# Patient Record
Sex: Male | Born: 1950 | Hispanic: Yes | Marital: Married | Smoking: Never smoker
Health system: Southern US, Community
[De-identification: ages and names within clinical notes are randomized; demographics above are authoritative.]

---

## 2019-04-08 ENCOUNTER — Ambulatory Visit
Admission: EM | Admit: 2019-04-08 | Discharge: 2019-04-08 | Disposition: A | Discharge status: Home / Self Care | Payer: Self-pay | Attending: Physician Assistant | Admitting: Physician Assistant

## 2019-04-08 ENCOUNTER — Encounter: Payer: Self-pay | Admitting: Emergency Medicine

## 2019-04-08 ENCOUNTER — Other Ambulatory Visit: Payer: Self-pay

## 2019-04-08 ENCOUNTER — Ambulatory Visit (INDEPENDENT_AMBULATORY_CARE_PROVIDER_SITE_OTHER): Payer: Self-pay

## 2019-04-08 DIAGNOSIS — R059 Cough, unspecified: Secondary | ICD-10-CM

## 2019-04-08 DIAGNOSIS — R05 Cough: Secondary | ICD-10-CM

## 2019-04-08 MED ORDER — BENZONATATE 200 MG PO CAPS: 200.0000 mg | ORAL_CAPSULE | Freq: Three times a day (TID) | ORAL | 0 refills | Status: AC

## 2019-04-08 NOTE — Discharge Instructions (Signed)
Your xray is negative for infection. As discussed, cannot rule out COVID. Currently, no alarming signs. I would like you to quarantine for 7 days since symptoms onset AND >72 hours without fever, and improved respiratory symptoms. Continue to monitor, you can call COVID hotline [phone]) or use [facility] online if symptoms worsens to determine where you should seek care. If experiencing shortness of breath, trouble breathing, call 911 and provide them with your current situation.

## 2019-04-08 NOTE — ED Triage Notes (Signed)
Pt presents to [facility] for assessment of cough x 1 week with headaches and muscle aches.  Denies fevers.

## 2019-04-08 NOTE — ED Notes (Signed)
Patient able to ambulate independently  

## 2019-04-08 NOTE — ED Provider Notes (Signed)
[facility]    CSN: [id number] Arrival date & time: 04/08/19  1539     History   Chief Complaint Chief Complaint  Patient presents with  . Cough    HPI Richard Monroe is a 68 y.o. male.   68 year old male comes in with son for 1 week history of URI symptoms.  HPI obtained by patient through [place] translator.  He has had cough, headaches, body aches.  Denies rhinorrhea, nasal congestion, sore throat.  Denies fever.  States had a few episodes of chills that has since resolved.  He denies shortness of breath, wheezing.  No obvious sick contact.  Never smoker.  Patient came to visit from [place] 2 months ago, has been traveling around [place], no known COVID contact.     History reviewed. No pertinent past medical history.  There are no active problems to display for this patient.   History reviewed. No pertinent surgical history.     Home Medications    Prior to Admission medications   Medication Sig Start Date End Date Taking? Authorizing Provider  benzonatate (TESSALON) 200 MG capsule Take 1 capsule (200 mg total) by mouth every 8 (eight) hours. 04/08/19   Belinda Fisher, PA-C    Family History History reviewed. No pertinent family history.  Social History Social History   Tobacco Use  . Smoking status: Never Smoker  . Smokeless tobacco: Never Used  Substance Use Topics  . Alcohol use: Yes    Alcohol/week: 3.0 standard drinks    Types: 3 Cans of beer per week  . Drug use: Never     Allergies   Patient has no known allergies.   Review of Systems Review of Systems  Reason unable to perform ROS: See HPI as above.     Physical Exam Triage Vital Signs ED Triage Vitals  Enc Vitals Group     BP 04/08/19 1556 138/89     Pulse Rate 04/08/19 1556 73     Resp 04/08/19 1556 18     Temp 04/08/19 1556 98.6 F (37 C)     Temp Source 04/08/19 1556 Oral     SpO2 04/08/19 1556 93 %     Weight --      Height --      Head Circumference --    Peak Flow --      Pain Score 04/08/19 1557 10     Pain Loc --      Pain Edu? --      Excl. in GC? --    No data found.  Updated Vital Signs BP 138/89 (BP Location: Right Arm)   Pulse 73   Temp 98.6 F (37 C) (Oral)   Resp 18   SpO2 93%   Physical Exam Constitutional:      General: He is not in acute distress.    Appearance: Normal appearance. He is not ill-appearing, toxic-appearing or diaphoretic.  HENT:     Head: Normocephalic and atraumatic.     Mouth/Throat:     Mouth: Mucous membranes are moist.     Pharynx: Oropharynx is clear. Uvula midline.  Neck:     Musculoskeletal: Normal range of motion and neck supple.  Cardiovascular:     Rate and Rhythm: Normal rate and regular rhythm.     Heart sounds: Normal heart sounds. No murmur. No friction rub. No gallop.   Pulmonary:     Effort: Pulmonary effort is normal. No accessory muscle usage, prolonged expiration, respiratory distress or  retractions.     Comments: Speaking in full sentences. Lungs clear to auscultation without adventitious lung sounds. Neurological:     General: No focal deficit present.     Mental Status: He is alert and oriented to person, place, and time.      UC Treatments / Results  Labs (all labs ordered are listed, but only abnormal results are displayed) Labs Reviewed - No data to display  EKG None  Radiology Dg Chest 2 View  Result Date: 04/08/2019 CLINICAL DATA:  Cough EXAM: CHEST - 2 VIEW COMPARISON:  None. FINDINGS: Heart and mediastinal contours are within normal limits. No focal opacities or effusions. No acute bony abnormality. IMPRESSION: No active cardiopulmonary disease. Electronically Signed   By: Charlett NoseKevin  Dover M.D.   On: 04/08/2019 16:55    Procedures Procedures (including critical care time)  Medications Ordered in UC Medications - No data to display  Initial Impression / Assessment and Plan / UC Course  I have reviewed the triage vital signs and the nursing notes.   Pertinent labs & imaging results that were available during my care of the patient were reviewed by me and considered in my medical decision making (see chart for details).    Xray negative for pneumonia. Patient speaking in full sentences without respiratory distress. Afebrile without antipyretic in last 8 hours. No tachycardia, tachypnea. Lungs CTAB. Satting at 93-95% on room air. COVID testing limited to inpatient. Given history and exam, will have patient self quarantine. Instructions on when to end quarantine, family member isolation discussed, and resources provided. Symptomatic treatment discussed. Return precautions given. Patient expresses understanding and agrees to plan.  Final Clinical Impressions(s) / UC Diagnoses   Final diagnoses:  Cough    ED Prescriptions    Medication Sig Dispense Auth. Provider   benzonatate (TESSALON) 200 MG capsule Take 1 capsule (200 mg total) by mouth every 8 (eight) hours. 21 capsule Threasa AlphaYu, Amy V, PA-C        Yu, Amy V, [place]-C 04/08/19 1708

## 2019-04-10 ENCOUNTER — Ambulatory Visit
Admission: EM | Admit: 2019-04-10 | Discharge: 2019-04-10 | Disposition: A | Discharge status: Home / Self Care | Payer: Self-pay | Attending: Emergency Medicine | Admitting: Emergency Medicine

## 2019-04-10 DIAGNOSIS — R05 Cough: Secondary | ICD-10-CM

## 2019-04-10 DIAGNOSIS — R059 Cough, unspecified: Secondary | ICD-10-CM

## 2019-04-10 DIAGNOSIS — Z20822 Contact with and (suspected) exposure to covid-19: Secondary | ICD-10-CM

## 2019-04-10 DIAGNOSIS — R509 Fever, unspecified: Secondary | ICD-10-CM

## 2019-04-10 MED ORDER — ACETAMINOPHEN 325 MG PO TABS
975.0000 mg | ORAL_TABLET | Freq: Once | ORAL | Status: AC
  Administered 2019-04-10: 975 mg via ORAL

## 2019-04-10 MED ORDER — HYDROCODONE-HOMATROPINE 5-1.5 MG/5ML PO SYRP: 5.0000 mL | ORAL_SOLUTION | Freq: Four times a day (QID) | ORAL | 0 refills | Status: AC | PRN

## 2019-04-10 NOTE — ED Provider Notes (Signed)
[facility]    CSN: [id number] Arrival date & time: 04/10/19  1657     History   Chief Complaint Chief Complaint  Patient presents with  . Cough    HPI Richard Monroe is a 68 y.o. male presenting with his son for unimproved cough and new onset fever.  Patient's son provides translation.  Patient's cough is unchanged from last visit (nonproductive, non-hemoptic).  Patient tried tessalon w/o relief, is now having head and stomach pain when he coughs and is having poor sleep/appetite.  Patient denies change in vision, weakness, nasal congestion/sinus pressure, chest pain, SOB, nausea/vomiting, diarrhea.  CXR done on 04/08/19 was negative for acute process.  Patient was found to be febrile in office, was responsive to APAP in office.  Has not tried this at home; has APAP & thermometer at home to monitor temp.  HPI from 04/08/19 visit:  "68 year old male comes in with son for 1 week history of URI symptoms.  HPI obtained by patient through [place] translator.  He has had cough, headaches, body aches.  Denies rhinorrhea, nasal congestion, sore throat.  Denies fever.  States had a few episodes of chills that has since resolved.  He denies shortness of breath, wheezing.  No obvious sick contact.  Never smoker.  Patient came to visit from [place] 2 months ago, has been traveling around [place], no known COVID contact."  History reviewed. No pertinent past medical history.  There are no active problems to display for this patient.   History reviewed. No pertinent surgical history.     Home Medications    Prior to Admission medications   Medication Sig Start Date End Date Taking? Authorizing Provider  benzonatate (TESSALON) 200 MG capsule Take 1 capsule (200 mg total) by mouth every 8 (eight) hours. 04/08/19   Cathie Hoops, Amy V, PA-C  HYDROcodone-homatropine (HYCODAN) 5-1.5 MG/5ML syrup Take 5 mLs by mouth every 6 (six) hours as needed for up to 5 days for cough. 04/10/19 04/15/19   Hall-Potvin, [place], PA-C    Family History No family history on file.  Social History Social History   Tobacco Use  . Smoking status: Never Smoker  . Smokeless tobacco: Never Used  Substance Use Topics  . Alcohol use: Yes    Alcohol/week: 3.0 standard drinks    Types: 3 Cans of beer per week  . Drug use: Never     Allergies   Patient has no known allergies.   Review of Systems Review of Systems  Constitutional: Positive for appetite change, fatigue and fever.  HENT: Negative for congestion, postnasal drip, rhinorrhea, sinus pressure, sinus pain, sore throat and trouble swallowing.   Eyes: Negative for pain and visual disturbance.  Respiratory: Positive for cough. Negative for chest tightness, shortness of breath and wheezing.   Cardiovascular: Negative for chest pain and palpitations.  Gastrointestinal: Positive for abdominal pain. Negative for diarrhea, nausea and vomiting.  Musculoskeletal: Negative for arthralgias and myalgias.  Neurological: Positive for headaches. Negative for dizziness and weakness.     Physical Exam Triage Vital Signs ED Triage Vitals  Enc Vitals Group     BP 04/10/19 1710 121/76     Pulse Rate 04/10/19 1710 86     Resp 04/10/19 1710 18     Temp 04/10/19 1710 (!) 103.2 F (39.6 C)     Temp Source 04/10/19 1710 Oral     SpO2 04/10/19 1710 94 %     Weight --      Height --  Head Circumference --      Peak Flow --      Pain Score 04/10/19 1711 0     Pain Loc --      Pain Edu? --      Excl. in GC? --    No data found.  Updated Vital Signs BP 121/76 (BP Location: Left Arm)   Pulse 86   Temp 99.7 F (37.6 C)   Resp 18   SpO2 94%   Visual Acuity Right Eye Distance:   Left Eye Distance:   Bilateral Distance:    Right Eye Near:   Left Eye Near:    Bilateral Near:     Physical Exam Constitutional:      General: He is not in acute distress.    Appearance: He is normal weight. He is not ill-appearing.  HENT:     Head:  Normocephalic and atraumatic.     Right Ear: Tympanic membrane, ear canal and external ear normal.     Left Ear: Tympanic membrane, ear canal and external ear normal.     Nose: Nose normal.     Mouth/Throat:     Mouth: Mucous membranes are moist.     Pharynx: Oropharynx is clear. No oropharyngeal exudate or posterior oropharyngeal erythema.  Eyes:     Extraocular Movements: Extraocular movements intact.     Conjunctiva/sclera: Conjunctivae normal.     Pupils: Pupils are equal, round, and reactive to light.  Neck:     Musculoskeletal: Neck supple.  Cardiovascular:     Rate and Rhythm: Normal rate and regular rhythm.  Pulmonary:     Effort: Pulmonary effort is normal. No respiratory distress.     Breath sounds: Normal breath sounds. No wheezing, rhonchi or rales.  Chest:     Chest wall: No tenderness.  Lymphadenopathy:     Cervical: No cervical adenopathy.  Neurological:     General: No focal deficit present.     Mental Status: He is alert and oriented to person, place, and time. Mental status is at baseline.      UC Treatments / Results  Labs (all labs ordered are listed, but only abnormal results are displayed) Labs Reviewed - No data to display  EKG None  Radiology No results found.  Procedures Procedures (including critical care time)  Medications Ordered in UC Medications  acetaminophen (TYLENOL) tablet 975 mg (975 mg Oral Given 04/10/19 1718)    Initial Impression / Assessment and Plan / UC Course  I have reviewed the triage vital signs and the nursing notes.  Pertinent labs & imaging results that were available during my care of the patient were reviewed by me and considered in my medical decision making (see chart for details).     Patient who was recently seen for cough, now here for lack of improvement with tessalon.  Patient was found to be febrile in office, was responsive to APAP in office.  Has not tried this at home; has APAP & thermometer at home to  monitor temp.  Patient was not in distress at time of appt.  Discussed possible COVID due to recent international travel history and symptomatology. Will continue symptomatic treatment with strict return precautions.    Final Clinical Impressions(s) / UC Diagnoses   Final diagnoses:  Cough  Fever, unspecified  Suspected Covid-19 Virus Infection     Discharge Instructions     Patient to start cough syrup PRN for cough. Discussed possible antibiotic if no improvement in nest 48-72 hours.  Will take OTC tylenol as needed for fever and monitor temperature at home. Discussed strict return precautions and to go to ER if symptoms worsen as well as isolation and hygiene regimen.  Patient and/or son will call clinic with any questions.    ED Prescriptions    Medication Sig Dispense Auth. Provider   HYDROcodone-homatropine (HYCODAN) 5-1.5 MG/5ML syrup Take 5 mLs by mouth every 6 (six) hours as needed for up to 5 days for cough. 100 mL Hall-Potvin, [place], PA-C     Controlled Substance Prescriptions [place] Controlled Substance Registry consulted? Yes, I have consulted the [place] Controlled Substances Registry for this patient, and feel the risk/benefit ratio today is favorable for proceeding with this prescription for a controlled substance.   Hall-Potvin, [place], [place]-C 04/10/19 1808

## 2019-04-10 NOTE — Discharge Instructions (Addendum)
Patient to start cough syrup PRN for cough. Discussed possible antibiotic if no improvement in nest 48-72 hours. Will take OTC tylenol as needed for fever and monitor temperature at home. Discussed strict return precautions and to go to ER if symptoms worsen as well as isolation and hygiene regimen.  Patient and/or son will call clinic with any questions.

## 2019-04-10 NOTE — ED Triage Notes (Signed)
Pt c/o productive cough x6 days with white sputum. States abdomen/back pain from coughing.

## 2020-09-06 IMAGING — DX CHEST - 2 VIEW
2 series · 2 of 2 positions shown · non-contrast
Comparison: None.

CLINICAL DATA: Cough

EXAM:
CHEST - 2 VIEW

[chest pa]
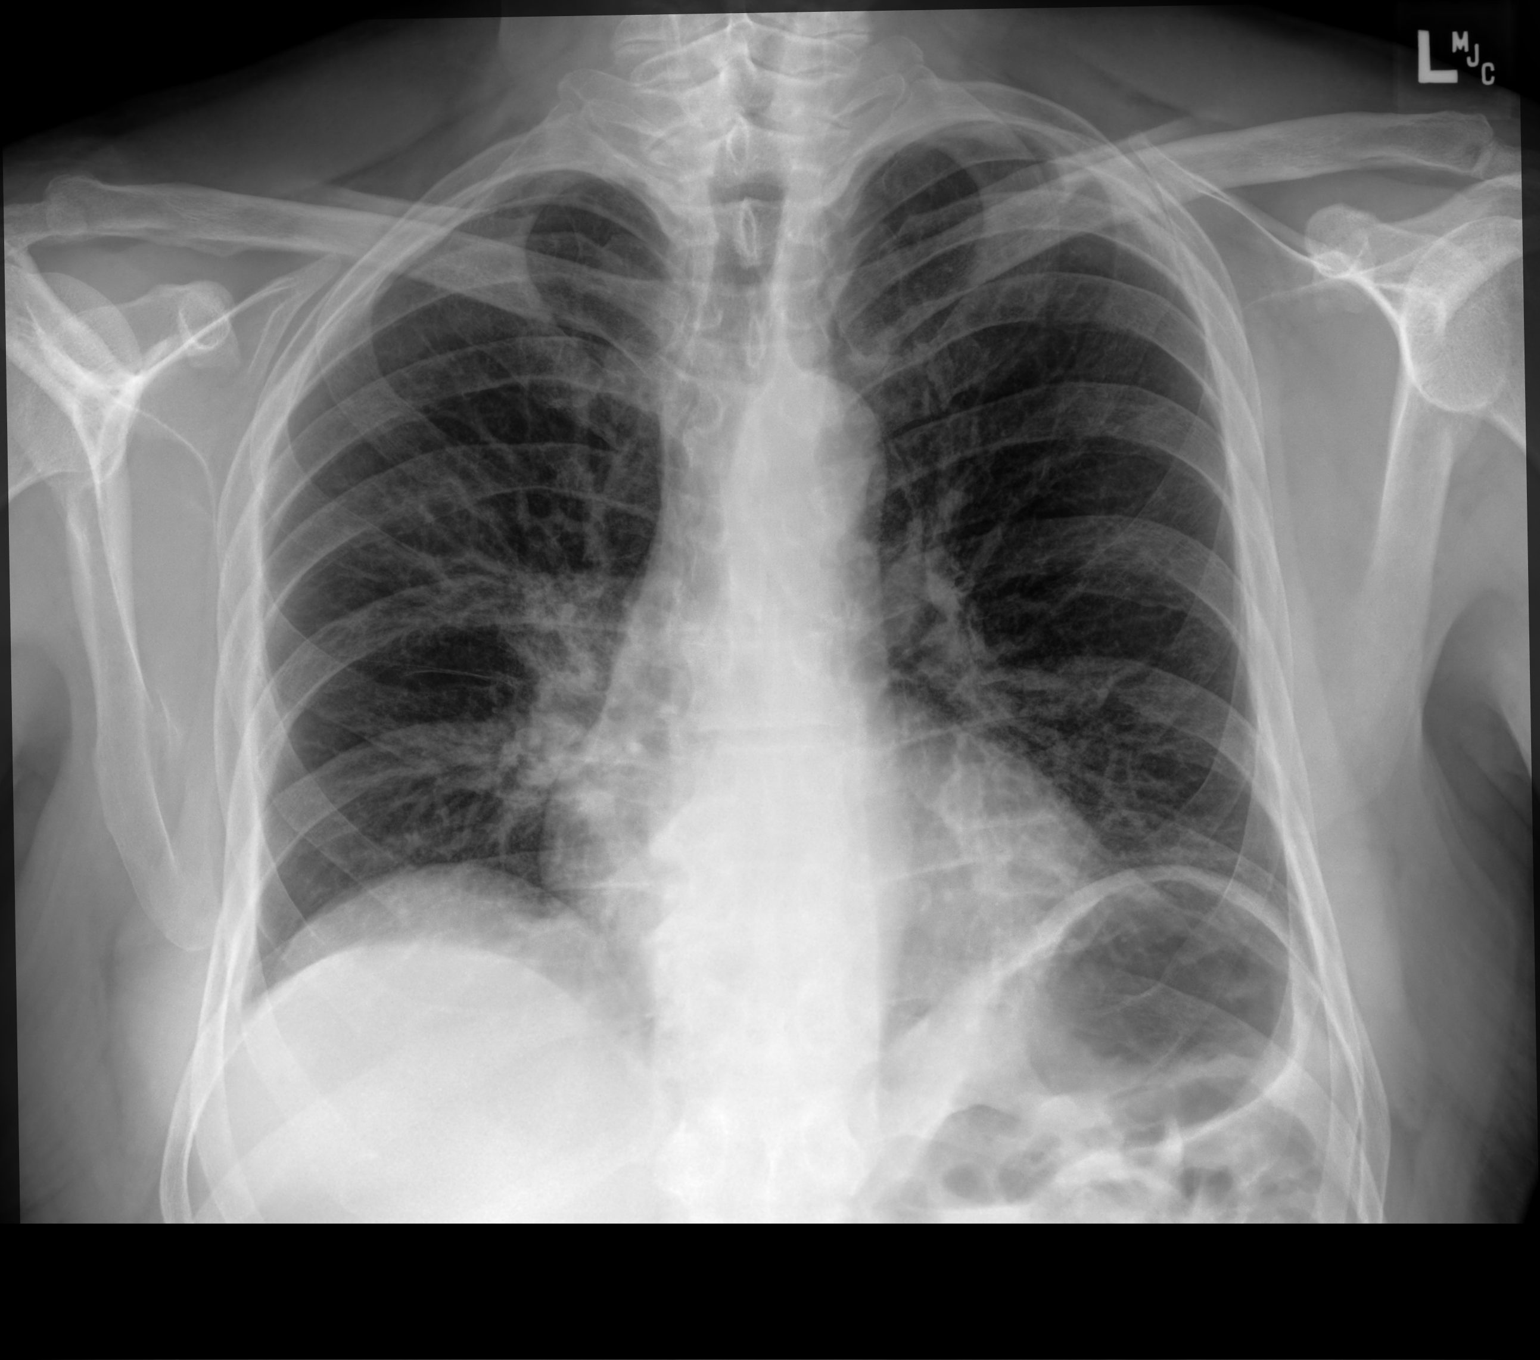

[chest lat]
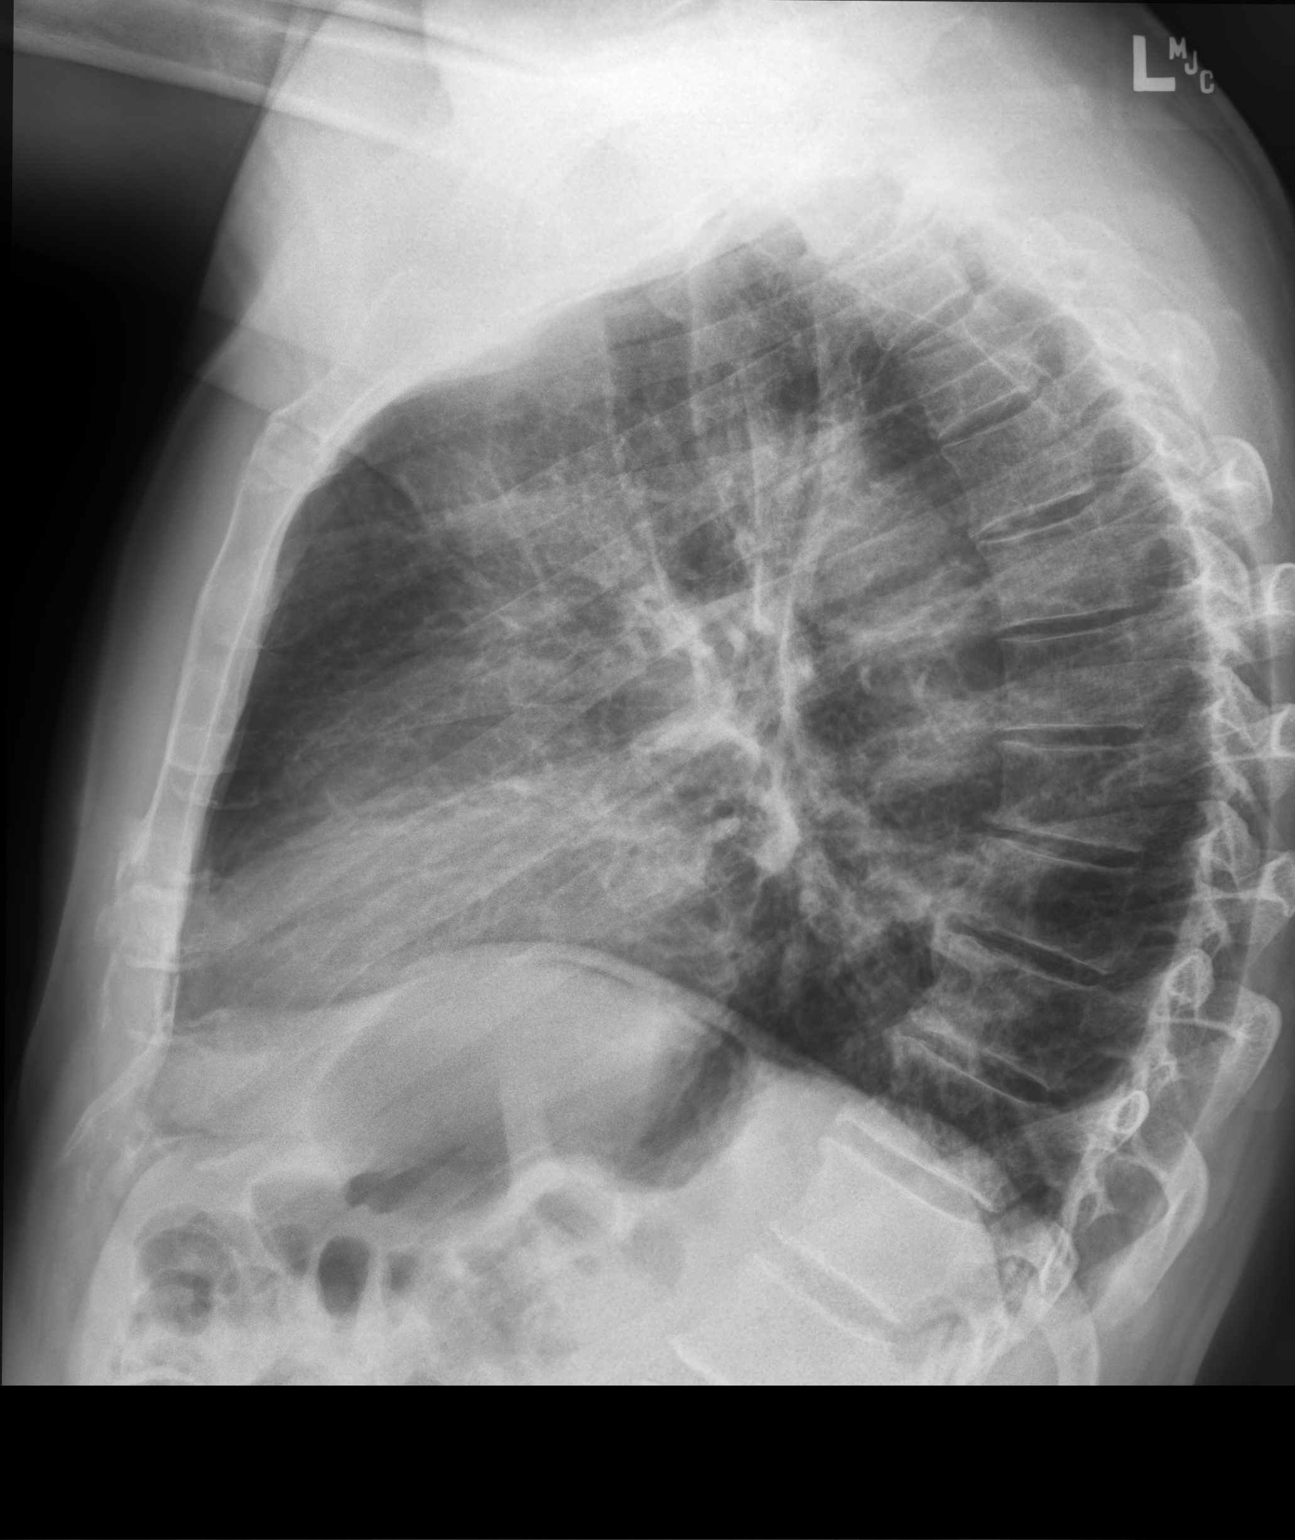

[2 of 2 positions shown; findings below may reference images not displayed]

FINDINGS: Heart and mediastinal contours are within normal limits. No focal
opacities or effusions. No acute bony abnormality.
IMPRESSION: No active cardiopulmonary disease.
